# Patient Record
Sex: Female | Born: 1995 | Race: Black or African American | Hispanic: No | Marital: Single | Smoking: Never smoker
Health system: Southern US, Community
[De-identification: ages and names within clinical notes are randomized; demographics above are authoritative.]

## PROBLEM LIST (undated history)

## (undated) ENCOUNTER — Inpatient Hospital Stay (HOSPITAL_COMMUNITY): Payer: Self-pay

## (undated) DIAGNOSIS — F32A Depression, unspecified: Secondary | ICD-10-CM

## (undated) DIAGNOSIS — F329 Major depressive disorder, single episode, unspecified: Secondary | ICD-10-CM

## (undated) DIAGNOSIS — F419 Anxiety disorder, unspecified: Secondary | ICD-10-CM

## (undated) DIAGNOSIS — M419 Scoliosis, unspecified: Secondary | ICD-10-CM

## (undated) HISTORY — PX: WISDOM TOOTH EXTRACTION: SHX21

---
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
## 2017-03-18 DIAGNOSIS — E669 Obesity, unspecified: Secondary | ICD-10-CM | POA: Insufficient documentation

## 2018-02-25 ENCOUNTER — Inpatient Hospital Stay (HOSPITAL_COMMUNITY)
Admission: AD | Admit: 2018-02-25 | Discharge: 2018-02-25 | Disposition: A | Discharge status: Home / Self Care | Payer: 59 | Source: Ambulatory Visit | Attending: Obstetrics & Gynecology | Admitting: Obstetrics & Gynecology

## 2018-02-25 ENCOUNTER — Encounter (HOSPITAL_COMMUNITY): Payer: Self-pay | Admitting: *Deleted

## 2018-02-25 DIAGNOSIS — Z3A08 8 weeks gestation of pregnancy: Secondary | ICD-10-CM | POA: Diagnosis not present

## 2018-02-25 DIAGNOSIS — O210 Mild hyperemesis gravidarum: Secondary | ICD-10-CM | POA: Diagnosis not present

## 2018-02-25 DIAGNOSIS — Z885 Allergy status to narcotic agent status: Secondary | ICD-10-CM | POA: Diagnosis not present

## 2018-02-25 DIAGNOSIS — B9689 Other specified bacterial agents as the cause of diseases classified elsewhere: Secondary | ICD-10-CM | POA: Diagnosis not present

## 2018-02-25 DIAGNOSIS — N760 Acute vaginitis: Secondary | ICD-10-CM

## 2018-02-25 DIAGNOSIS — O23591 Infection of other part of genital tract in pregnancy, first trimester: Secondary | ICD-10-CM | POA: Insufficient documentation

## 2018-02-25 DIAGNOSIS — N899 Noninflammatory disorder of vagina, unspecified: Secondary | ICD-10-CM | POA: Diagnosis present

## 2018-02-25 DIAGNOSIS — O211 Hyperemesis gravidarum with metabolic disturbance: Secondary | ICD-10-CM | POA: Insufficient documentation

## 2018-02-25 HISTORY — DX: Depression, unspecified: F32.A

## 2018-02-25 HISTORY — DX: Scoliosis, unspecified: M41.9

## 2018-02-25 HISTORY — DX: Major depressive disorder, single episode, unspecified: F32.9

## 2018-02-25 HISTORY — DX: Anxiety disorder, unspecified: F41.9

## 2018-02-25 LAB — URINALYSIS, ROUTINE W REFLEX MICROSCOPIC
Bilirubin Urine: NEGATIVE
Glucose, UA: NEGATIVE mg/dL
HGB URINE DIPSTICK: NEGATIVE
Ketones, ur: NEGATIVE mg/dL
LEUKOCYTES UA: NEGATIVE
NITRITE: NEGATIVE
PROTEIN: NEGATIVE mg/dL
Specific Gravity, Urine: 1.026 (ref 1.005–1.030)
pH: 8 (ref 5.0–8.0)

## 2018-02-25 LAB — WET PREP, GENITAL
Sperm: NONE SEEN
Trich, Wet Prep: NONE SEEN
YEAST WET PREP: NONE SEEN

## 2018-02-25 MED ORDER — METRONIDAZOLE 0.75 % VA GEL: 1.0000 | Freq: Every day | VAGINAL | 0 refills | Status: AC

## 2018-02-25 MED ORDER — PRENATAL 19 29-1 MG PO TABS: 1.0000 | ORAL_TABLET | Freq: Every day | ORAL | 11 refills | Status: AC

## 2018-02-25 NOTE — Discharge Instructions (Signed)
Apply for medicaid to make sure you have maternity benefits. Get the vaginal cream prescribed to your pharmacy. Drink at least 8 8-oz glasses of water every day. Take your prenatal vitamin every day. Get vitamin B6 50 mg and Unisom (plain) and take one tablet of each at bedtime.  Can repeat in the morning if having nausea. Eat small meals every 2 hours - graze rather than eating a meal. Make an appointment to begin prenatal care. - list of providers given.

## 2018-02-25 NOTE — MAU Provider Note (Signed)
History     CSN: [id number]  Arrival date and time: 02/25/18 1523   First Provider Initiated Contact with Patient 02/25/18 1554      Chief Complaint  Patient presents with  . Vaginitis   HPI Story D Hildenbrand 22 y.o. [id number]  Comes to MAU with odor noted - thinking she has a yeast infection or a UTI.  No vaginal bleeding, no pain today.  Has an appointment with a provider in [facility] for prenatal care but wants to deliver her baby here at [facility].  Has been having some nausea and has phenergan but hesitates to take it due to drowsiness.  OB History    Gravida Para Term Preterm AB Living   1             SAB TAB Ectopic Multiple Live Births                  Past Medical History:  Diagnosis Date  . Anxiety   . Depression   . Scoliosis    known since 5th grade    Past Surgical History:  Procedure Laterality Date  . WISDOM TOOTH EXTRACTION      History reviewed. No pertinent family history.  Social History   Tobacco Use  . Smoking status: Never Smoker  . Smokeless tobacco: Never Used  Substance Use Topics  . Alcohol use: No    Frequency: Never  . Drug use: No    Allergies:  Allergies  Allergen Reactions  . Benadryl [Diphenhydramine] Itching  . Morphine And Related Itching  . Prozac [Fluoxetine Hcl] Hives and Itching    No medications prior to admission.    Review of Systems  Constitutional: Negative for fever.  Gastrointestinal: Positive for nausea. Negative for constipation, diarrhea and vomiting.       Has had cramping but none today  Genitourinary: Positive for vaginal discharge. Negative for dysuria and vaginal bleeding.       Smell of urine or vaginal discharge  Musculoskeletal: Positive for back pain.   Physical Exam   Blood pressure 129/69, pulse 85, temperature 98.7 F (37.1 C), temperature source Oral, resp. rate 18, height 5\' 4"  (1.626 m), weight 182 lb (82.6 kg), last menstrual period 12/25/2017.  Physical Exam  Nursing note  and vitals reviewed. Constitutional: She is oriented to person, place, and time. She appears well-developed and well-nourished.  HENT:  Head: Normocephalic.  Eyes: EOM are normal.  Neck: Neck supple.  GI: Soft. There is no tenderness. There is no rebound and no guarding.  Genitourinary:  Genitourinary Comments: Speculum exam: Vagina - Small amount of white discharge, no odor Cervix - No contact bleeding, cervix appears closed Bimanual exam: Cervix closed Uterus non tender, 7 week size Adnexa non tender, no masses bilaterally GC/Chlam, wet prep done Chaperone present for exam.   Musculoskeletal: Normal range of motion.  Neurological: She is alert and oriented to person, place, and time.  Skin: Skin is warm and dry.  Psychiatric: She has a normal mood and affect.    MAU Course  Procedures Results for orders placed or performed during the hospital encounter of 02/25/18 (from the past 24 hour(s))  Urinalysis, Routine w reflex microscopic     Status: None   Collection Time: 02/25/18  3:40 PM  Result Value Ref Range   Color, Urine YELLOW YELLOW   APPearance CLEAR CLEAR   Specific Gravity, Urine 1.026 1.005 - 1.030   pH 8.0 5.0 - 8.0   Glucose, UA  NEGATIVE NEGATIVE mg/dL   Hgb urine dipstick NEGATIVE NEGATIVE   Bilirubin Urine NEGATIVE NEGATIVE   Ketones, ur NEGATIVE NEGATIVE mg/dL   Protein, ur NEGATIVE NEGATIVE mg/dL   Nitrite NEGATIVE NEGATIVE   Leukocytes, UA NEGATIVE NEGATIVE  Wet prep, genital     Status: Abnormal   Collection Time: 02/25/18  4:58 PM  Result Value Ref Range   Yeast Wet Prep HPF POC NONE SEEN NONE SEEN   Trich, Wet Prep NONE SEEN NONE SEEN   Clue Cells Wet Prep HPF POC PRESENT (A) NONE SEEN   WBC, Wet Prep HPF POC MODERATE (A) NONE SEEN   Sperm NONE SEEN     MDM No pain with exam of uterus and adnexa.  No need to evaluate for ectopic pregnancy today.  As she is unsure if her insurance has maternity benefits, suggest adding over the counter meds to  assist with nausea.  Assessment and Plan  Mild dehydration BV Morning sickness - [redacted]w[redacted]d by LMP  Plan Apply for medicaid to make sure you have maternity benefits. Get the vaginal cream prescribed to your pharmacy. Drink at least 8 8-oz glasses of water every day. Take your prenatal vitamin every day. Get vitamin B6 50 mg and Unisom (plain) and take one tablet of each at bedtime.  Can repeat in the morning if having nausea. Eat small meals every 2 hours - graze rather than eating a meal. Make an appointment to begin prenatal care as soon as possible. - list of providers given.  Milany Geck L Icey Tello 02/25/2018, 4:10 PM

## 2018-02-25 NOTE — MAU Note (Signed)
Pt reports white discharge with odor. Thinks it is yeast. Has appointment on Monday but she cannot keep it so office told her to come to MAU since she is pregnant.

## 2018-02-26 LAB — GC/CHLAMYDIA PROBE AMP (~~LOC~~) NOT AT ARMC
Chlamydia: NEGATIVE
Neisseria Gonorrhea: NEGATIVE

## 2018-02-26 LAB — POCT PREGNANCY, URINE: PREG TEST UR: POSITIVE — AB

## 2018-03-30 DIAGNOSIS — O24119 Pre-existing diabetes mellitus, type 2, in pregnancy, unspecified trimester: Secondary | ICD-10-CM | POA: Insufficient documentation

## 2018-03-31 ENCOUNTER — Telehealth: Payer: Self-pay | Admitting: General Practice

## 2018-03-31 ENCOUNTER — Encounter: Payer: 59 | Admitting: Obstetrics and Gynecology

## 2018-03-31 NOTE — Telephone Encounter (Signed)
Patient was scheduled for New OB appointment today.  Patient stated that she has established care elsewhere.

## 2018-04-02 DIAGNOSIS — O99341 Other mental disorders complicating pregnancy, first trimester: Secondary | ICD-10-CM | POA: Insufficient documentation

## 2018-04-02 DIAGNOSIS — Q211 Atrial septal defect, unspecified: Secondary | ICD-10-CM | POA: Insufficient documentation

## 2018-06-08 DIAGNOSIS — K219 Gastro-esophageal reflux disease without esophagitis: Secondary | ICD-10-CM | POA: Insufficient documentation

## 2018-12-24 ENCOUNTER — Encounter (HOSPITAL_COMMUNITY): Payer: Self-pay

## 2020-01-31 DIAGNOSIS — Z01818 Encounter for other preprocedural examination: Secondary | ICD-10-CM

## 2023-03-06 DIAGNOSIS — Z789 Other specified health status: Secondary | ICD-10-CM | POA: Insufficient documentation

## 2023-03-06 DIAGNOSIS — N8189 Other female genital prolapse: Secondary | ICD-10-CM | POA: Insufficient documentation

## 2023-03-06 DIAGNOSIS — M6289 Other specified disorders of muscle: Secondary | ICD-10-CM | POA: Insufficient documentation

## 2023-03-06 DIAGNOSIS — N39490 Overflow incontinence: Secondary | ICD-10-CM | POA: Insufficient documentation

## 2023-12-31 DIAGNOSIS — Z8759 Personal history of other complications of pregnancy, childbirth and the puerperium: Secondary | ICD-10-CM | POA: Insufficient documentation

## 2024-01-08 LAB — PANORAMA PRENATAL TEST FULL PANEL:PANORAMA TEST PLUS 5 ADDITIONAL MICRODELETIONS: FETAL FRACTION: 6.2

## 2024-01-27 DIAGNOSIS — J189 Pneumonia, unspecified organism: Secondary | ICD-10-CM | POA: Insufficient documentation

## 2024-01-27 DIAGNOSIS — K5900 Constipation, unspecified: Secondary | ICD-10-CM | POA: Insufficient documentation

## 2024-01-27 DIAGNOSIS — R0989 Other specified symptoms and signs involving the circulatory and respiratory systems: Secondary | ICD-10-CM | POA: Insufficient documentation

## 2024-02-03 ENCOUNTER — Encounter (HOSPITAL_COMMUNITY): Payer: Self-pay | Admitting: Obstetrics and Gynecology

## 2024-02-03 ENCOUNTER — Inpatient Hospital Stay (HOSPITAL_COMMUNITY)
Admission: AD | Admit: 2024-02-03 | Discharge: 2024-02-04 | Disposition: A | Discharge status: Home / Self Care | Payer: 59 | Attending: Obstetrics and Gynecology | Admitting: Obstetrics and Gynecology

## 2024-02-03 DIAGNOSIS — R1010 Upper abdominal pain, unspecified: Secondary | ICD-10-CM | POA: Diagnosis not present

## 2024-02-03 DIAGNOSIS — Z3A15 15 weeks gestation of pregnancy: Secondary | ICD-10-CM | POA: Diagnosis not present

## 2024-02-03 DIAGNOSIS — K5900 Constipation, unspecified: Secondary | ICD-10-CM | POA: Diagnosis not present

## 2024-02-03 DIAGNOSIS — R748 Abnormal levels of other serum enzymes: Secondary | ICD-10-CM | POA: Insufficient documentation

## 2024-02-03 DIAGNOSIS — O26899 Other specified pregnancy related conditions, unspecified trimester: Secondary | ICD-10-CM

## 2024-02-03 DIAGNOSIS — O99612 Diseases of the digestive system complicating pregnancy, second trimester: Secondary | ICD-10-CM | POA: Diagnosis not present

## 2024-02-03 DIAGNOSIS — K8689 Other specified diseases of pancreas: Secondary | ICD-10-CM | POA: Diagnosis present

## 2024-02-03 DIAGNOSIS — O26892 Other specified pregnancy related conditions, second trimester: Secondary | ICD-10-CM | POA: Insufficient documentation

## 2024-02-03 DIAGNOSIS — R1013 Epigastric pain: Secondary | ICD-10-CM | POA: Insufficient documentation

## 2024-02-03 DIAGNOSIS — R102 Pelvic and perineal pain: Secondary | ICD-10-CM | POA: Diagnosis not present

## 2024-02-03 LAB — CBC
HCT: 31.3 % — ABNORMAL LOW (ref 36.0–46.0)
Hemoglobin: 10.6 g/dL — ABNORMAL LOW (ref 12.0–15.0)
MCH: 30.6 pg (ref 26.0–34.0)
MCHC: 33.9 g/dL (ref 30.0–36.0)
MCV: 90.5 fL (ref 80.0–100.0)
Platelets: 226 10*3/uL (ref 150–400)
RBC: 3.46 MIL/uL — ABNORMAL LOW (ref 3.87–5.11)
RDW: 13 % (ref 11.5–15.5)
WBC: 9.8 10*3/uL (ref 4.0–10.5)
nRBC: 0.2 % (ref 0.0–0.2)

## 2024-02-03 LAB — COMPREHENSIVE METABOLIC PANEL
ALT: 18 U/L (ref 0–44)
AST: 20 U/L (ref 15–41)
Albumin: 3.1 g/dL — ABNORMAL LOW (ref 3.5–5.0)
Alkaline Phosphatase: 52 U/L (ref 38–126)
Anion gap: 11 (ref 5–15)
BUN: <5 mg/dL — ABNORMAL LOW (ref 6–20)
CO2: 22 mmol/L (ref 22–32)
Calcium: 9.2 mg/dL (ref 8.9–10.3)
Chloride: 101 mmol/L (ref 98–111)
Creatinine, Ser: 0.54 mg/dL (ref 0.44–1.00)
GFR, Estimated: >60 mL/min (ref >60)
Glucose, Bld: 95 mg/dL (ref 70–99)
Potassium: 3.7 mmol/L (ref 3.5–5.1)
Sodium: 134 mmol/L — ABNORMAL LOW (ref 135–145)
Total Bilirubin: 0.3 mg/dL (ref 0.0–1.2)
Total Protein: 6.7 g/dL (ref 6.5–8.1)

## 2024-02-03 LAB — LIPASE, BLOOD: Lipase: 170 U/L — ABNORMAL HIGH (ref 11–51)

## 2024-02-03 LAB — URINALYSIS, ROUTINE W REFLEX MICROSCOPIC
Bilirubin Urine: NEGATIVE
Glucose, UA: NEGATIVE mg/dL
Hgb urine dipstick: NEGATIVE
Ketones, ur: NEGATIVE mg/dL
Leukocytes,Ua: NEGATIVE
Nitrite: NEGATIVE
Protein, ur: NEGATIVE mg/dL
Specific Gravity, Urine: 1.017 (ref 1.005–1.030)
pH: 6 (ref 5.0–8.0)

## 2024-02-03 LAB — AMYLASE: Amylase: 251 U/L — ABNORMAL HIGH (ref 28–100)

## 2024-02-03 MED ORDER — ALUM & MAG HYDROXIDE-SIMETH 200-200-20 MG/5ML PO SUSP
30.0000 mL | Freq: Once | ORAL | Status: AC
  Administered 2024-02-03: 30 mL via ORAL
  Filled 2024-02-03: qty 30

## 2024-02-03 MED ORDER — PSEUDOEPHEDRINE HCL 30 MG PO TABS
60.0000 mg | ORAL_TABLET | Freq: Once | ORAL | Status: AC
  Administered 2024-02-03: 60 mg via ORAL
  Filled 2024-02-03: qty 2

## 2024-02-03 NOTE — MAU Note (Signed)
 soap sud enema instilled without pt complaint-catheter passed without resistance

## 2024-02-03 NOTE — MAU Provider Note (Signed)
 Chief Complaint:  Abdominal Pain   Event Date/Time   First Provider Initiated Contact with Patient 02/03/24 2219     HPI: Colleen Knapp is a 28 y.o. G2P0 at 49w5dwho presents to maternity admissions reporting pain in epigastric area and some pelvic cramping  Has a history of pancreatitis. Is followed by GI for this.  Has been constipated lately with little response to laxatives.  Being treated for pneumonia . Colleen Knapp She denies LOF, vaginal bleeding, urinary symptoms, n/v, diarrhea, or fever/chills.    Abdominal Pain This is a recurrent problem. The problem has been unchanged. The pain is located in the epigastric region and suprapubic region. The quality of the pain is cramping. The abdominal pain does not radiate. Associated symptoms include constipation. Pertinent negatives include no anorexia, diarrhea, dysuria, fever or frequency. The pain is relieved by Nothing. She has tried nothing for the symptoms.   RN Note: .Colleen Knapp is a 28 y.o. at 15.5wk here in MAU reporting: upper abdominal pain-stabbing, shooting pain and lower uterine cramping that is intermittent.  Upper pain began 3 days ago- and lower 1 week ago. Has been diagnosed with walking pneumonia. Upper airway congestion. Pt reports mucous is streaked with blood. Taking Amoxicillin for pneumonia started 01/29/2024. Tonight upper abdominal pain increased from 4 to 8 on 0-10 pain scale. Denies fever or chills, or productive cough No nausea or vomiting, diarrhea Does have constipation Last BM 02/03/2024  Was seen at [facility]  Pain score: 8 upper abdomen  Past Medical History: Past Medical History:  Diagnosis Date   Anxiety    Depression    Scoliosis    known since 5th grade    Past obstetric history: OB History  Gravida Para Term Preterm AB Living  2       SAB IAB Ectopic Multiple Live Births          # Outcome Date GA Lbr Len/2nd Weight Sex Type Anes PTL Lv  2 Current           1 Gravida             Past  Surgical History: Past Surgical History:  Procedure Laterality Date   WISDOM TOOTH EXTRACTION      Family History: No family history on file.  Social History: Social History   Tobacco Use   Smoking status: Never   Smokeless tobacco: Never  Substance Use Topics   Alcohol use: No   Drug use: No    Allergies:  Allergies  Allergen Reactions   Benadryl [Diphenhydramine] Itching   Morphine And Codeine Itching   Prozac [Fluoxetine Hcl] Hives and Itching    Meds:  Medications Prior to Admission  Medication Sig Dispense Refill Last Dose/Taking   metroNIDAZOLE  (METROGEL  VAGINAL) 0.75 % vaginal gel Place 1 Applicatorful vaginally at bedtime. 70 g 0     I have reviewed patient's Past Medical Hx, Surgical Hx, Family Hx, Social Hx, medications and allergies.   ROS:  Review of Systems  Constitutional:  Negative for fever.  Gastrointestinal:  Positive for abdominal pain and constipation. Negative for anorexia and diarrhea.  Genitourinary:  Negative for dysuria and frequency.   Other systems negative  Physical Exam  Patient Vitals for the past 24 hrs:  BP Temp Temp src Pulse Resp SpO2 Height Weight  02/03/24 1957 118/74 97.9 F (36.6 C) Oral 91 17 98 % 5' 4 (1.626 m) 83.8 kg   Constitutional: Well-developed, well-nourished female in no acute distress.  Cardiovascular: normal rate  and rhythm Respiratory: normal effort, clear to auscultation bilaterally GI: Abd soft, mildly tender, gravid appropriate for gestational age.   No rebound or guarding. MS: Extremities nontender, no edema, normal ROM Neurologic: Alert and oriented x 4.  GU: Neg CVAT.  FHT:  160   Labs: Results for orders placed or performed during the hospital encounter of 02/03/24 (from the past 24 hours)  CBC     Status: Abnormal   Collection Time: 02/03/24  8:22 PM  Result Value Ref Range   WBC 9.8 4.0 - 10.5 K/uL   RBC 3.46 (L) 3.87 - 5.11 MIL/uL   Hemoglobin 10.6 (L) 12.0 - 15.0 g/dL   HCT 68.6 (L)  63.9 - 46.0 %   MCV 90.5 80.0 - 100.0 fL   MCH 30.6 26.0 - 34.0 pg   MCHC 33.9 30.0 - 36.0 g/dL   RDW 86.9 88.4 - 84.4 %   Platelets 226 150 - 400 K/uL   nRBC 0.2 0.0 - 0.2 %  Comprehensive metabolic panel     Status: Abnormal   Collection Time: 02/03/24  8:22 PM  Result Value Ref Range   Sodium 134 (L) 135 - 145 mmol/L   Potassium 3.7 3.5 - 5.1 mmol/L   Chloride 101 98 - 111 mmol/L   CO2 22 22 - 32 mmol/L   Glucose, Bld 95 70 - 99 mg/dL   BUN <5 (L) 6 - 20 mg/dL   Creatinine, Ser 9.45 0.44 - 1.00 mg/dL   Calcium 9.2 8.9 - 89.6 mg/dL   Total Protein 6.7 6.5 - 8.1 g/dL   Albumin 3.1 (L) 3.5 - 5.0 g/dL   AST 20 15 - 41 U/L   ALT 18 0 - 44 U/L   Alkaline Phosphatase 52 38 - 126 U/L   Total Bilirubin 0.3 0.0 - 1.2 mg/dL   GFR, Estimated >39 >39 mL/min   Anion gap 11 5 - 15  Lipase, blood     Status: Abnormal   Collection Time: 02/03/24  8:22 PM  Result Value Ref Range   Lipase 170 (H) 11 - 51 U/L  Amylase     Status: Abnormal   Collection Time: 02/03/24  8:22 PM  Result Value Ref Range   Amylase 251 (H) 28 - 100 U/L  Urinalysis, Routine w reflex microscopic -Urine, Clean Catch     Status: None   Collection Time: 02/03/24  8:28 PM  Result Value Ref Range   Color, Urine YELLOW YELLOW   APPearance CLEAR CLEAR   Specific Gravity, Urine 1.017 1.005 - 1.030   pH 6.0 5.0 - 8.0   Glucose, UA NEGATIVE NEGATIVE mg/dL   Hgb urine dipstick NEGATIVE NEGATIVE   Bilirubin Urine NEGATIVE NEGATIVE   Ketones, ur NEGATIVE NEGATIVE mg/dL   Protein, ur NEGATIVE NEGATIVE mg/dL   Nitrite NEGATIVE NEGATIVE   Leukocytes,Ua NEGATIVE NEGATIVE     Imaging:  No results found.  MAU Course/MDM: I have reviewed the triage vital signs and the nursing notes.   Pertinent labs & imaging results that were available during my care of the patient were reviewed by me and considered in my medical decision making (see chart for details).      I have reviewed her medical records including past results,  notes and treatments.   I have ordered labs and reviewed results. These are normal except for lipase and amylase.   Treatments in MAU included Soap Suds enema given with large results  patient states abdominal pain is gone. States feels  so much better. .    Assessment: Single IUP at [redacted]w[redacted]d Abdominal pain Constipation Elevated Amylase and Lipase  Plan: Discharge home Rx Miralax  for constipaiton Followup with OB and GI  Follow up in Office for prenatal visits and recheck Encouraged to return if she develops worsening of symptoms, increase in pain, fever, or other concerning symptoms.   Pt stable at time of discharge.  Earnie Pouch CNM, MSN Certified Nurse-Midwife 02/03/2024 10:19 PM

## 2024-02-03 NOTE — MAU Note (Signed)
.  Colleen Knapp is a 28 y.o. at 15.5wk here in MAU reporting: upper abdominal pain-stabbing, shooting pain and lower uterine cramping that is intermittent.  Upper pain began 3 days ago- and lower 1 week ago. Has been diagnosed with walking pneumonia. Upper airway congestion. Pt reports mucous is streaked with blood. Taking Amoxicillin for pneumonia started 01/29/2024. Tonight upper abdominal pain increased from 4 to 8 on 0-10 pain scale. Denies fever or chills, or productive cough No nausea or vomiting, diarrhea Does have constipation Last BM 02/03/2024  Was seen at [facility]  Pain score: 8 upper abdomen Vitals:   02/03/24 1957  BP: 118/74  Pulse: 91  Resp: 17  Temp: 97.9 F (36.6 C)  SpO2: 98%     FHT: 160bpm  Lab orders placed from triage: Droplet

## 2024-02-04 DIAGNOSIS — R109 Unspecified abdominal pain: Secondary | ICD-10-CM

## 2024-02-04 DIAGNOSIS — Z3A15 15 weeks gestation of pregnancy: Secondary | ICD-10-CM

## 2024-02-04 DIAGNOSIS — R748 Abnormal levels of other serum enzymes: Secondary | ICD-10-CM

## 2024-02-04 DIAGNOSIS — K5900 Constipation, unspecified: Secondary | ICD-10-CM

## 2024-02-04 DIAGNOSIS — R1010 Upper abdominal pain, unspecified: Secondary | ICD-10-CM

## 2024-02-04 DIAGNOSIS — O26892 Other specified pregnancy related conditions, second trimester: Secondary | ICD-10-CM

## 2024-02-04 MED ORDER — POLYETHYLENE GLYCOL 3350 17 G PO PACK: 17.0000 g | PACK | Freq: Every day | ORAL | 0 refills | Status: AC
